# Patient Record
Sex: Male | Born: 1995 | Race: Black or African American | Hispanic: No | Marital: Single | Smoking: Never smoker
Health system: Southern US, Community
[De-identification: ages and names within clinical notes are randomized; demographics above are authoritative.]

---

## 2016-04-18 ENCOUNTER — Encounter (HOSPITAL_COMMUNITY): Payer: Self-pay | Admitting: Neurology

## 2016-04-18 ENCOUNTER — Emergency Department (HOSPITAL_COMMUNITY): Payer: 59

## 2016-04-18 ENCOUNTER — Emergency Department (HOSPITAL_COMMUNITY)
Admission: EM | Admit: 2016-04-18 | Discharge: 2016-04-18 | Disposition: A | Discharge status: Home / Self Care | Payer: 59 | Attending: Emergency Medicine | Admitting: Emergency Medicine

## 2016-04-18 DIAGNOSIS — R42 Dizziness and giddiness: Secondary | ICD-10-CM | POA: Insufficient documentation

## 2016-04-18 DIAGNOSIS — Y9355 Activity, bike riding: Secondary | ICD-10-CM | POA: Insufficient documentation

## 2016-04-18 DIAGNOSIS — Y999 Unspecified external cause status: Secondary | ICD-10-CM | POA: Insufficient documentation

## 2016-04-18 DIAGNOSIS — Y92410 Unspecified street and highway as the place of occurrence of the external cause: Secondary | ICD-10-CM | POA: Insufficient documentation

## 2016-04-18 DIAGNOSIS — R52 Pain, unspecified: Secondary | ICD-10-CM | POA: Insufficient documentation

## 2016-04-18 NOTE — ED Notes (Signed)
Rhodopie Ipock--- [place] -- [place] --  [phone]- [phone] Caryn BeeKevin Severino -- Father -- [phone]
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
## 2016-04-18 NOTE — ED Notes (Signed)
Patient transported to CT 

## 2016-04-18 NOTE — ED Notes (Signed)
assit pt.with undressing into gown his clothes are wet an pt.was cold  Gave warm blankets
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
## 2020-11-05 DEATH — deceased
